# Patient Record
Sex: Male | Born: 1990 | Race: White | Hispanic: No | Marital: Single | Smoking: Current every day smoker
Health system: Southern US, Community
[De-identification: ages and names within clinical notes are randomized; demographics above are authoritative.]

---
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
## 2014-10-16 ENCOUNTER — Emergency Department (HOSPITAL_COMMUNITY): Payer: Self-pay

## 2014-10-16 ENCOUNTER — Encounter (HOSPITAL_COMMUNITY): Payer: Self-pay | Admitting: Emergency Medicine

## 2014-10-16 ENCOUNTER — Emergency Department (HOSPITAL_COMMUNITY)
Admission: EM | Admit: 2014-10-16 | Discharge: 2014-10-16 | Disposition: A | Discharge status: Home / Self Care | Payer: Self-pay | Attending: Emergency Medicine | Admitting: Emergency Medicine

## 2014-10-16 DIAGNOSIS — Y9389 Activity, other specified: Secondary | ICD-10-CM | POA: Insufficient documentation

## 2014-10-16 DIAGNOSIS — S62607A Fracture of unspecified phalanx of left little finger, initial encounter for closed fracture: Secondary | ICD-10-CM

## 2014-10-16 DIAGNOSIS — S41112A Laceration without foreign body of left upper arm, initial encounter: Secondary | ICD-10-CM

## 2014-10-16 DIAGNOSIS — Y9289 Other specified places as the place of occurrence of the external cause: Secondary | ICD-10-CM | POA: Insufficient documentation

## 2014-10-16 DIAGNOSIS — Z720 Tobacco use: Secondary | ICD-10-CM | POA: Insufficient documentation

## 2014-10-16 DIAGNOSIS — S62637A Displaced fracture of distal phalanx of left little finger, initial encounter for closed fracture: Secondary | ICD-10-CM | POA: Insufficient documentation

## 2014-10-16 DIAGNOSIS — Y998 Other external cause status: Secondary | ICD-10-CM | POA: Insufficient documentation

## 2014-10-16 DIAGNOSIS — Z23 Encounter for immunization: Secondary | ICD-10-CM | POA: Insufficient documentation

## 2014-10-16 DIAGNOSIS — S61512A Laceration without foreign body of left wrist, initial encounter: Secondary | ICD-10-CM | POA: Insufficient documentation

## 2014-10-16 DIAGNOSIS — Y280XXA Contact with sharp glass, undetermined intent, initial encounter: Secondary | ICD-10-CM | POA: Insufficient documentation

## 2014-10-16 DIAGNOSIS — S61412A Laceration without foreign body of left hand, initial encounter: Secondary | ICD-10-CM | POA: Insufficient documentation

## 2014-10-16 MED ORDER — TETANUS-DIPHTH-ACELL PERTUSSIS 5-2.5-18.5 LF-MCG/0.5 IM SUSP
0.5000 mL | Freq: Once | INTRAMUSCULAR | Status: AC
  Administered 2014-10-16: 0.5 mL via INTRAMUSCULAR
  Filled 2014-10-16: qty 0.5

## 2014-10-16 MED ORDER — LIDOCAINE HCL (PF) 1 % IJ SOLN
INTRAMUSCULAR | Status: AC
  Filled 2014-10-16: qty 5

## 2014-10-16 MED ORDER — BACITRACIN ZINC 500 UNIT/GM EX OINT
TOPICAL_OINTMENT | CUTANEOUS | Status: AC
  Filled 2014-10-16: qty 1.8

## 2014-10-16 MED ORDER — IBUPROFEN 800 MG PO TABS: 800.0000 mg | ORAL_TABLET | Freq: Three times a day (TID) | ORAL | Status: AC | PRN

## 2014-10-16 NOTE — Discharge Instructions (Signed)
Finger Fracture °Fractures of fingers are breaks in the bones of the fingers. There are many types of fractures. There are different ways of treating these fractures. Your health care provider will discuss the best way to treat your fracture. °CAUSES °Traumatic injury is the main cause of broken fingers. These include: °· Injuries while playing sports. °· Workplace injuries. °· Falls. °RISK FACTORS °Activities that can increase your risk of finger fractures include: °· Sports. °· Workplace activities that involve machinery. °· A condition called osteoporosis, which can make your bones less dense and cause them to fracture more easily. °SIGNS AND SYMPTOMS °The main symptoms of a broken finger are pain and swelling within 15 minutes after the injury. Other symptoms include: °· Bruising of your finger. °· Stiffness of your finger. °· Numbness of your finger. °· Exposed bones (compound fracture) if the fracture is severe. °DIAGNOSIS  °The best way to diagnose a broken bone is with X-ray imaging. Additionally, your health care provider will use this X-ray image to evaluate the position of the broken finger bones.  °TREATMENT  °Finger fractures can be treated with:  °· Nonreduction--This means the bones are in place. The finger is splinted without changing the positions of the bone pieces. The splint is usually left on for about a week to 10 days. This will depend on your fracture and what your health care provider thinks. °· Closed reduction--The bones are put back into position without using surgery. The finger is then splinted. °· Open reduction and internal fixation--The fracture site is opened. Then the bone pieces are fixed into place with pins or some type of hardware. This is seldom required. It depends on the severity of the fracture. °HOME CARE INSTRUCTIONS  °· Follow your health care provider's instructions regarding activities, exercises, and physical therapy. °· Only take over-the-counter or prescription  medicines for pain, discomfort, or fever as directed by your health care provider. °SEEK MEDICAL CARE IF: °You have pain or swelling that limits the motion or use of your fingers. °SEEK IMMEDIATE MEDICAL CARE IF:  °Your finger becomes numb. °MAKE SURE YOU:  °· Understand these instructions. °· Will watch your condition. °· Will get help right away if you are not doing well or get worse. °Document Released: 06/04/2000 Document Revised: 12/11/2012 Document Reviewed: 10/02/2012 °[employer]® Patient Information ©2015 [employer]. This information is not intended to replace advice given to you by your health care provider. Make sure you discuss any questions you have with your health care provider. ° °Laceration Care, Adult °A laceration is a cut or lesion that goes through all layers of the skin and into the tissue just beneath the skin. °TREATMENT  °Some lacerations may not require closure. Some lacerations may not be able to be closed due to an increased risk of infection. It is important to see your caregiver as soon as possible after an injury to minimize the risk of infection and maximize the opportunity for successful closure. °If closure is appropriate, pain medicines may be given, if needed. The wound will be cleaned to help prevent infection. Your caregiver will use stitches (sutures), staples, wound glue (adhesive), or skin adhesive strips to repair the laceration. These tools bring the skin edges together to allow for faster healing and a better cosmetic outcome. However, all wounds will heal with a scar. Once the wound has healed, scarring can be minimized by covering the wound with sunscreen during the day for 1 full year. °HOME CARE INSTRUCTIONS  °For sutures or staples: °·   Keep the wound clean and dry. °· If you were given a bandage (dressing), you should change it at least once a day. Also, change the dressing if it becomes wet or dirty, or as directed by your caregiver. °· Wash the wound with soap and  water 2 times a day. Rinse the wound off with water to remove all soap. Pat the wound dry with a clean towel. °· After cleaning, apply a thin layer of the antibiotic ointment as recommended by your caregiver. This will help prevent infection and keep the dressing from sticking. °· You may shower as usual after the first 24 hours. Do not soak the wound in water until the sutures are removed. °· Only take over-the-counter or prescription medicines for pain, discomfort, or fever as directed by your caregiver. °· Get your sutures or staples removed as directed by your caregiver. °For skin adhesive strips: °· Keep the wound clean and dry. °· Do not get the skin adhesive strips wet. You may bathe carefully, using caution to keep the wound dry. °· If the wound gets wet, pat it dry with a clean towel. °· Skin adhesive strips will fall off on their own. You may trim the strips as the wound heals. Do not remove skin adhesive strips that are still stuck to the wound. They will fall off in time. °For wound adhesive: °· You may briefly wet your wound in the shower or bath. Do not soak or scrub the wound. Do not swim. Avoid periods of heavy perspiration until the skin adhesive has fallen off on its own. After showering or bathing, gently pat the wound dry with a clean towel. °· Do not apply liquid medicine, cream medicine, or ointment medicine to your wound while the skin adhesive is in place. This may loosen the film before your wound is healed. °· If a dressing is placed over the wound, be careful not to apply tape directly over the skin adhesive. This may cause the adhesive to be pulled off before the wound is healed. °· Avoid prolonged exposure to sunlight or tanning lamps while the skin adhesive is in place. Exposure to ultraviolet light in the first year will darken the scar. °· The skin adhesive will usually remain in place for 5 to 10 days, then naturally fall off the skin. Do not pick at the adhesive film. °You may need  a tetanus shot if: °· You cannot remember when you had your last tetanus shot. °· You have never had a tetanus shot. °If you get a tetanus shot, your arm may swell, get red, and feel warm to the touch. This is common and not a problem. If you need a tetanus shot and you choose not to have one, there is a rare chance of getting tetanus. Sickness from tetanus can be serious. °SEEK MEDICAL CARE IF:  °· You have redness, swelling, or increasing pain in the wound. °· You see a red line that goes away from the wound. °· You have yellowish-white fluid (pus) coming from the wound. °· You have a fever. °· You notice a bad smell coming from the wound or dressing. °· Your wound breaks open before or after sutures have been removed. °· You notice something coming out of the wound such as wood or glass. °· Your wound is on your hand or foot and you cannot move a finger or toe. °SEEK IMMEDIATE MEDICAL CARE IF:  °· Your pain is not controlled with prescribed medicine. °· You have severe   swelling around the wound causing pain and numbness or a change in color in your arm, hand, leg, or foot. °· Your wound splits open and starts bleeding. °· You have worsening numbness, weakness, or loss of function of any joint around or beyond the wound. °· You develop painful lumps near the wound or on the skin anywhere on your body. °MAKE SURE YOU:  °· Understand these instructions. °· Will watch your condition. °· Will get help right away if you are not doing well or get worse. °Document Released: 02/20/2005 Document Revised: 05/15/2011 Document Reviewed: 08/16/2010 °[employer]® Patient Information ©2015 [employer]. This information is not intended to replace advice given to you by your health care provider. Make sure you discuss any questions you have with your health care provider. ° °

## 2014-10-16 NOTE — ED Notes (Signed)
Patient complaining of lacerations to left hand and wrist after hand went through glass.

## 2014-10-16 NOTE — ED Provider Notes (Signed)
TIME SEEN: 2:20 AM  CHIEF COMPLAINT: Left hand lacerations  HPI: Pt is a 24 y.o. left-hand dominant male who presents emergency department after he punched through a glass window and sustained lacerations to his left hand and left wrist. States he did this because he was mad. No other injury. He is unsure when his last tetanus vaccination was.   ROS: See HPI Constitutional: no fever  Eyes: no drainage  ENT: no runny nose   Cardiovascular:  no chest pain  Resp: no SOB  GI: no vomiting GU: no dysuria Integumentary: no rash  Allergy: no hives  Musculoskeletal: no leg swelling  Neurological: no slurred speech ROS otherwise negative  PAST MEDICAL HISTORY/PAST SURGICAL HISTORY:  History reviewed. No pertinent past medical history.  MEDICATIONS:  Prior to Admission medications   Not on File    ALLERGIES:  No Known Allergies  SOCIAL HISTORY:  Social History  Substance Use Topics  . Smoking status: Current Every Day Smoker  . Smokeless tobacco: Not on file  . Alcohol Use: Yes     Comment: occasional    FAMILY HISTORY: History reviewed. No pertinent family history.  EXAM: BP 135/72 mmHg  Pulse 78  Temp(Src) 98.1 F (36.7 C) (Oral)  Resp 20  Ht 6' (1.829 m)  Wt 165 lb (74.844 kg)  BMI 22.37 kg/m2  SpO2 98% CONSTITUTIONAL: Alert and oriented and responds appropriately to questions. Well-appearing; well-nourished HEAD: Normocephalic EYES: Conjunctivae clear, PERRL ENT: normal nose; no rhinorrhea; moist mucous membranes; pharynx without lesions noted NECK: Supple, no meningismus, no LAD  CARD: RRR; S1 and S2 appreciated; no murmurs, no clicks, no rubs, no gallops RESP: Normal chest excursion without splinting or tachypnea; breath sounds clear and equal bilaterally; no wheezes, no rhonchi, no rales, no hypoxia or respiratory distress, speaking full sentences ABD/GI: Normal bowel sounds; non-distended; soft, non-tender, no rebound, no guarding, no peritoneal signs BACK:   The back appears normal and is non-tender to palpation, there is no CVA tenderness EXT: Normal ROM in all joints; non-tender to palpation; no edema; normal capillary refill; no cyanosis, no calf tenderness or swelling; no bony deformity or tenderness of the left hand or wrist, full range of motion and full extension and flexion in his fingers and wrist, 2+ radial pulses bilaterally   SKIN: Normal color for age and race; warm, multiple lacerations to the left hand and wrist without tendon involvement NEURO: Moves all extremities equally, sensation to light touch intact diffusely, cranial nerves II through XII intact PSYCH: The patient's mood and manner are appropriate. Grooming and personal hygiene are appropriate.  MEDICAL DECISION MAKING: Patient here with lacerations to his left hand and wrist. X-ray shows no foreign body. There is a lucency of the fifth proximal phalanx concerning for incomplete fracture. He is not tender in this area but will place him in an aluminum splint. Have advised him to keep this on for the next 4 weeks. Lacerations have been repaired. Tetanus updated. Discussed wound care instructions, asked return precautions. He verbalizes understanding and is comfortable with plan.    LACERATION REPAIR Performed by: Raelyn Number Authorized by: Raelyn Number Consent: Verbal consent obtained. Risks and benefits: risks, benefits and alternatives were discussed Consent given by: patient Patient identity confirmed: provided demographic data Prepped and Draped in normal sterile fashion Wound explored  Laceration Location: Left second digit  Laceration Length: 4 cm  No Foreign Bodies seen or palpated  Anesthesia: local infiltration  Local anesthetic: lidocaine 1 % without  epinephrine  Anesthetic total: 3 ml  Irrigation method: syringe Amount of cleaning: standard  Skin closure: Simple interrupted   Number of sutures: 4  Technique: Area was anesthetized using a  digital block with 1% lidocaine without epinephrine. Area irrigated copiously with saline. Cleaned with Betadine and draped in sterile fashion. Wound repaired using 4 4-0 simple interrupted Prolene sutures. Bacitracin applied as well as sterile dressing.   Patient tolerance: Patient tolerated the procedure well with no immediate complications.  LACERATION REPAIR Performed by: Raelyn Number Authorized by: Raelyn Number Consent: Verbal consent obtained. Risks and benefits: risks, benefits and alternatives were discussed Consent given by: patient Patient identity confirmed: provided demographic data Prepped and Draped in normal sterile fashion Wound explored  Laceration Location: Left volar wrist on the ulnar aspect  Laceration Length: 2 cm  No Foreign Bodies seen or palpated  Anesthesia: local infiltration  Local anesthetic: lidocaine 1 % without epinephrine  Anesthetic total: 2 ml  Irrigation method: syringe Amount of cleaning: standard  Skin closure: Simple   Number of sutures: 2   Technique: Area was anesthetized using 1% lidocaine without epinephrine. Area irrigated copiously with saline. Cleaned with Betadine and draped in sterile fashion. Wound repaired using 2 4-0 simple interrupted Prolene sutures. Bacitracin applied as well as sterile dressing.   Patient tolerance: Patient tolerated the procedure well with no immediate complications.   LACERATION REPAIR Performed by: Raelyn Number Authorized by: Raelyn Number Consent: Verbal consent obtained. Risks and benefits: risks, benefits and alternatives were discussed Consent given by: patient Patient identity confirmed: provided demographic data Prepped and Draped in normal sterile fashion Wound explored  Laceration Location: Left third digit on the dorsal aspect  Laceration Length: 8 cm  No Foreign Bodies seen or palpated  Anesthesia: local infiltration  Local anesthetic: lidocaine 1 % without  epinephrine  Anesthetic total: 3 ml  Irrigation method: syringe Amount of cleaning: standard  Skin closure: Simple   Number of sutures: 13  Technique: Area was anesthetized using a digital block with 1% lidocaine without epinephrine. Area irrigated copiously with saline. Cleaned with Betadine and draped in sterile fashion. Wound repaired using 13 4-0 simple interrupted Prolene sutures. Bacitracin applied as well as sterile dressing.   Patient tolerance: Patient tolerated the procedure well with no immediate complications.   NERVE BLOCK Date/Time: 10/16/2014 4:00 AM Performed by: Raelyn Number Authorized by: Raelyn Number Consent: Verbal consent obtained. Risks and benefits: risks, benefits and alternatives were discussed Consent given by: patient Patient identity confirmed: verbally with patient Indications: pain relief and extensive wound Body area: upper extremity (second third digits of the left hand) Nerve: digital Laterality: left Patient sedated: no Preparation: Patient was prepped and draped in the usual sterile fashion. Patient position: sitting Needle gauge: 27 G Location technique: anatomical landmarks Local anesthetic: lidocaine 1% without epinephrine Anesthetic total: 3 ml Outcome: pain improved Patient tolerance: Patient tolerated the procedure well with no immediate complications     SPLINT APPLICATION Date/Time: 4:23 AM Authorized by: Raelyn Number Consent: Verbal consent obtained. Risks and benefits: risks, benefits and alternatives were discussed Consent given by: patient Splint applied by:  technician Location details: Left fifth digit  Splint type: Aluminum splint  Supplies used: Aluminum finger splint  Post-procedure: The splinted body part was neurovascularly unchanged following the procedure. Patient tolerance: Patient tolerated the procedure well with no immediate complications.     Layla Maw Selma Rodelo, DO 10/16/14 [phone]

## 2014-12-06 ENCOUNTER — Encounter: Payer: Self-pay | Admitting: Emergency Medicine

## 2014-12-06 ENCOUNTER — Emergency Department
Admission: EM | Admit: 2014-12-06 | Discharge: 2014-12-06 | Disposition: A | Discharge status: Home / Self Care | Payer: Self-pay | Attending: Emergency Medicine | Admitting: Emergency Medicine

## 2014-12-06 DIAGNOSIS — Z720 Tobacco use: Secondary | ICD-10-CM | POA: Insufficient documentation

## 2014-12-06 DIAGNOSIS — Y9289 Other specified places as the place of occurrence of the external cause: Secondary | ICD-10-CM | POA: Insufficient documentation

## 2014-12-06 DIAGNOSIS — Y9389 Activity, other specified: Secondary | ICD-10-CM | POA: Insufficient documentation

## 2014-12-06 DIAGNOSIS — S39012A Strain of muscle, fascia and tendon of lower back, initial encounter: Secondary | ICD-10-CM | POA: Insufficient documentation

## 2014-12-06 DIAGNOSIS — M62830 Muscle spasm of back: Secondary | ICD-10-CM | POA: Insufficient documentation

## 2014-12-06 DIAGNOSIS — Y998 Other external cause status: Secondary | ICD-10-CM | POA: Insufficient documentation

## 2014-12-06 MED ORDER — MELOXICAM 15 MG PO TABS: 15.0000 mg | ORAL_TABLET | Freq: Every day | ORAL | Status: AC

## 2014-12-06 MED ORDER — CYCLOBENZAPRINE HCL 10 MG PO TABS: 10.0000 mg | ORAL_TABLET | Freq: Three times a day (TID) | ORAL | Status: AC | PRN

## 2014-12-06 NOTE — Discharge Instructions (Signed)
Lumbosacral Strain Lumbosacral strain is a strain of any of the parts that make up your lumbosacral vertebrae. Your lumbosacral vertebrae are the bones that make up the lower third of your backbone. Your lumbosacral vertebrae are held together by muscles and tough, fibrous tissue (ligaments).  CAUSES  A sudden blow to your back can cause lumbosacral strain. Also, anything that causes an excessive stretch of the muscles in the low back can cause this strain. This is typically seen when people exert themselves strenuously, fall, lift heavy objects, bend, or crouch repeatedly. RISK FACTORS  Physically demanding work.  Participation in pushing or pulling sports or sports that require a sudden twist of the back (tennis, golf, baseball).  Weight lifting.  Excessive lower back curvature.  Forward-tilted pelvis.  Weak back or abdominal muscles or both.  Tight hamstrings. SIGNS AND SYMPTOMS  Lumbosacral strain may cause pain in the area of your injury or pain that moves (radiates) down your leg.  DIAGNOSIS Your health care provider can often diagnose lumbosacral strain through a physical exam. In some cases, you may need tests such as X-ray exams.  TREATMENT  Treatment for your lower back injury depends on many factors that your clinician will have to evaluate. However, most treatment will include the use of anti-inflammatory medicines. HOME CARE INSTRUCTIONS   Avoid hard physical activities (tennis, racquetball, waterskiing) if you are not in proper physical condition for it. This may aggravate or create problems.  If you have a back problem, avoid sports requiring sudden body movements. Swimming and walking are generally safer activities.  Maintain good posture.  Maintain a healthy weight.  For acute conditions, you may put ice on the injured area.  Put ice in a plastic bag.  Place a towel between your skin and the bag.  Leave the ice on for 20 minutes, 2-3 times a day.  When the  low back starts healing, stretching and strengthening exercises may be recommended. SEEK MEDICAL CARE IF:  Your back pain is getting worse.  You experience severe back pain not relieved with medicines. SEEK IMMEDIATE MEDICAL CARE IF:   You have numbness, tingling, weakness, or problems with the use of your arms or legs.  There is a change in bowel or bladder control.  You have increasing pain in any area of the body, including your belly (abdomen).  You notice shortness of breath, dizziness, or feel faint.  You feel sick to your stomach (nauseous), are throwing up (vomiting), or become sweaty.  You notice discoloration of your toes or legs, or your feet get very cold. MAKE SURE YOU:   Understand these instructions.  Will watch your condition.  Will get help right away if you are not doing well or get worse. Document Released: 11/30/2004 Document Revised: 02/25/2013 Document Reviewed: 10/09/2012 [facility] Patient Information 2015 [place], [place]. This information is not intended to replace advice given to you by your health care provider. Make sure you discuss any questions you have with your health care provider.   Take medication as prescribed. At least once an hour exercise lower back
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
## 2014-12-15 ENCOUNTER — Emergency Department (HOSPITAL_COMMUNITY)
Admission: EM | Admit: 2014-12-15 | Discharge: 2014-12-15 | Disposition: A | Discharge status: Home / Self Care | Payer: Self-pay | Attending: Emergency Medicine | Admitting: Emergency Medicine

## 2014-12-15 ENCOUNTER — Encounter (HOSPITAL_COMMUNITY): Payer: Self-pay | Admitting: Emergency Medicine

## 2014-12-15 DIAGNOSIS — Z720 Tobacco use: Secondary | ICD-10-CM | POA: Insufficient documentation

## 2014-12-15 DIAGNOSIS — M545 Low back pain: Secondary | ICD-10-CM | POA: Insufficient documentation
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
## 2014-12-15 NOTE — Discharge Instructions (Signed)
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
## 2014-12-15 NOTE — ED Notes (Signed)
MD at bedside. 
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
